# Patient Record
Sex: Female | Born: 1990 | Hispanic: Yes | Marital: Single | Smoking: Never smoker
Health system: Southern US, Community
[De-identification: ages and names within clinical notes are randomized; demographics above are authoritative.]

## PROBLEM LIST (undated history)

## (undated) DIAGNOSIS — Z789 Other specified health status: Secondary | ICD-10-CM

## (undated) HISTORY — PX: NO PAST SURGERIES: SHX2092

---

## 2018-11-27 NOTE — L&D Delivery Note (Signed)
Delivery Note At 2:24 PM a viable female was delivered via Vaginal, Spontaneous (Presentation:VTX/ LOA ;  ).  APGAR8/9: , ; weight: unassigned   .   Placenta status:intact  , .  Cord:  with the following complications:+ COVID patient   Anesthesia:  Iv stadol Episiotomy: none   Lacerations:  none Suture Repair: n/a Est. Blood Loss (mL):  300 cc  Mom to Stay in LDR #5  Baby to Couplet care / Skin to Skin.  Jessica Douglas 08/06/2019, 2:50 PM

## 2019-01-20 ENCOUNTER — Emergency Department: Payer: Self-pay

## 2019-01-20 ENCOUNTER — Encounter: Payer: Self-pay | Admitting: Emergency Medicine

## 2019-01-20 ENCOUNTER — Emergency Department
Admission: EM | Admit: 2019-01-20 | Discharge: 2019-01-20 | Disposition: A | Discharge status: Home / Self Care | Payer: Self-pay | Attending: Emergency Medicine | Admitting: Emergency Medicine

## 2019-01-20 DIAGNOSIS — R102 Pelvic and perineal pain: Secondary | ICD-10-CM | POA: Insufficient documentation

## 2019-01-20 DIAGNOSIS — M545 Low back pain: Secondary | ICD-10-CM | POA: Insufficient documentation

## 2019-01-20 DIAGNOSIS — O9989 Other specified diseases and conditions complicating pregnancy, childbirth and the puerperium: Secondary | ICD-10-CM | POA: Insufficient documentation

## 2019-01-20 DIAGNOSIS — Z3491 Encounter for supervision of normal pregnancy, unspecified, first trimester: Secondary | ICD-10-CM

## 2019-01-20 DIAGNOSIS — Z3A08 8 weeks gestation of pregnancy: Secondary | ICD-10-CM | POA: Insufficient documentation

## 2019-01-20 LAB — URINALYSIS, COMPLETE (UACMP) WITH MICROSCOPIC
BILIRUBIN URINE: NEGATIVE
Bacteria, UA: NONE SEEN
Glucose, UA: NEGATIVE mg/dL
KETONES UR: NEGATIVE mg/dL
LEUKOCYTE UA: NEGATIVE
Nitrite: NEGATIVE
Protein, ur: NEGATIVE mg/dL
Specific Gravity, Urine: 1.026 (ref 1.005–1.030)
pH: 5 (ref 5.0–8.0)

## 2019-01-20 LAB — HCG, QUANTITATIVE, PREGNANCY: hCG, Beta Chain, Quant, S: 166234 m[IU]/mL — ABNORMAL HIGH (ref <5)

## 2019-01-20 LAB — CBC
HCT: 37.9 % (ref 36.0–46.0)
Hemoglobin: 12.7 g/dL (ref 12.0–15.0)
MCH: 28.4 pg (ref 26.0–34.0)
MCHC: 33.5 g/dL (ref 30.0–36.0)
MCV: 84.8 fL (ref 80.0–100.0)
NRBC: 0 % (ref 0.0–0.2)
Platelets: 311 10*3/uL (ref 150–400)
RBC: 4.47 MIL/uL (ref 3.87–5.11)
RDW: 13.3 % (ref 11.5–15.5)
WBC: 9.9 10*3/uL (ref 4.0–10.5)

## 2019-01-20 LAB — POCT PREGNANCY, URINE: Preg Test, Ur: POSITIVE — AB

## 2019-01-20 LAB — COMPREHENSIVE METABOLIC PANEL
ALT: 33 U/L (ref 0–44)
AST: 24 U/L (ref 15–41)
Albumin: 4.1 g/dL (ref 3.5–5.0)
Alkaline Phosphatase: 58 U/L (ref 38–126)
Anion gap: 9 (ref 5–15)
BUN: 10 mg/dL (ref 6–20)
CO2: 21 mmol/L — ABNORMAL LOW (ref 22–32)
Calcium: 9.7 mg/dL (ref 8.9–10.3)
Chloride: 103 mmol/L (ref 98–111)
Creatinine, Ser: 0.52 mg/dL (ref 0.44–1.00)
GFR calc Af Amer: >60 mL/min (ref >60)
GFR calc non Af Amer: >60 mL/min (ref >60)
Glucose, Bld: 118 mg/dL — ABNORMAL HIGH (ref 70–99)
POTASSIUM: 3.4 mmol/L — AB (ref 3.5–5.1)
SODIUM: 133 mmol/L — AB (ref 135–145)
Total Bilirubin: 0.6 mg/dL (ref 0.3–1.2)
Total Protein: 7.6 g/dL (ref 6.5–8.1)

## 2019-01-20 LAB — LIPASE, BLOOD: Lipase: 26 U/L (ref 11–51)

## 2019-01-20 LAB — ABO/RH: ABO/RH(D): A POS

## 2019-01-20 MED ORDER — PRENATAL COMPLETE 14-0.4 MG PO TABS: 1.0000 | ORAL_TABLET | Freq: Every day | ORAL | 3 refills | Status: AC

## 2019-01-20 MED ORDER — ONDANSETRON 4 MG PO TBDP: 4.0000 mg | ORAL_TABLET | Freq: Three times a day (TID) | ORAL | 0 refills | Status: AC | PRN

## 2019-01-20 NOTE — ED Provider Notes (Signed)
[facility] Emergency Department Provider Note  ____________________________________________   First MD Initiated Contact with Patient 01/20/19 1611     (approximate)  I have reviewed the triage vital signs and the nursing notes.   HISTORY  Chief Complaint Abdominal Pain and Possible Pregnancy    HPI Jessica Douglas is a 28 y.o. female presents emergency department complaint of low back pain and abdominal cramping.  She thinks she is 7 to [redacted] weeks pregnant.  No bleeding but states she had similar symptoms when she had a miscarriage.  She denies any fever or chills.  She denies any known injury.    History reviewed. No pertinent past medical history.  There are no active problems to display for this patient.   History reviewed. No pertinent surgical history.  Prior to Admission medications   Medication Sig Start Date End Date Taking? Authorizing Provider  ondansetron (ZOFRAN-ODT) 4 MG disintegrating tablet Take 1 tablet (4 mg total) by mouth every 8 (eight) hours as needed. 01/20/19   Sherrie Mustache Roselyn Bering, PA-C  Prenatal Vit-Fe Fumarate-FA (PRENATAL COMPLETE) 14-0.4 MG TABS Take 1 tablet by mouth daily. 01/20/19   [name], PA-C    Allergies Patient has no allergy information on record.  No family history on file.  Social History Social History   Tobacco Use  . Smoking status: Not on file  Substance Use Topics  . Alcohol use: Not on file  . Drug use: Not on file    Review of Systems  Constitutional: No fever/chills Eyes: No visual changes. ENT: No sore throat. Respiratory: Denies cough Genitourinary: Negative for dysuria.  Positive for cramping, positive pregnancy Musculoskeletal: Negative for back pain. Skin: Negative for rash.    ____________________________________________   PHYSICAL EXAM:  VITAL SIGNS: ED Triage Vitals  Enc Vitals Group     BP 01/20/19 1502 114/75     Pulse Rate 01/20/19 1502 97     Resp 01/20/19  1502 18     Temp 01/20/19 1502 98.8 F (37.1 C)     Temp Source 01/20/19 1502 Oral     SpO2 01/20/19 1502 100 %     Weight 01/20/19 1456 140 lb (63.5 kg)     Height 01/20/19 1456 5\' 4"  (1.626 m)     Head Circumference --      Peak Flow --      Pain Score 01/20/19 1456 1     Pain Loc --      Pain Edu? --      Excl. in GC? --     Constitutional: Alert and oriented. Well appearing and in no acute distress. Eyes: Conjunctivae are normal.  Head: Atraumatic. Nose: No congestion/rhinnorhea. Mouth/Throat: Mucous membranes are moist.   Neck:  supple no lymphadenopathy noted Cardiovascular: Normal rate, regular rhythm. Heart sounds are normal Respiratory: Normal respiratory effort.  No retractions, lungs c t a  Abd: soft tender in left lower quadrant, Bs normal all 4 quad GU: deferred Musculoskeletal: FROM all extremities, warm and well perfused Neurologic:  Normal speech and language.  Skin:  Skin is warm, dry and intact. No rash noted. Psychiatric: Mood and affect are normal. Speech and behavior are normal.  ____________________________________________   LABS (all labs ordered are listed, but only abnormal results are displayed)  Labs Reviewed  HCG, QUANTITATIVE, PREGNANCY - Abnormal; Notable for the following components:      Result Value   hCG, Beta Francene Finders 628,366 (*)    All other  components within normal limits  COMPREHENSIVE METABOLIC PANEL - Abnormal; Notable for the following components:   Sodium 133 (*)    Potassium 3.4 (*)    CO2 21 (*)    Glucose, Bld 118 (*)    All other components within normal limits  URINALYSIS, COMPLETE (UACMP) WITH MICROSCOPIC - Abnormal; Notable for the following components:   Color, Urine YELLOW (*)    APPearance CLEAR (*)    Hgb urine dipstick SMALL (*)    All other components within normal limits  POCT PREGNANCY, URINE - Abnormal; Notable for the following components:   Preg Test, Ur POSITIVE (*)    All other components within  normal limits  LIPASE, BLOOD  CBC  POC URINE PREG, ED  ABO/RH   ____________________________________________   ____________________________________________  RADIOLOGY  Ultrasound OB, shows a viable IUP with  heartbeat  ____________________________________________   PROCEDURES  Procedure(s) performed: No  Procedures    ____________________________________________   INITIAL IMPRESSION / ASSESSMENT AND PLAN / ED COURSE  Pertinent labs & imaging results that were available during my care of the patient were reviewed by me and considered in my medical decision making (see chart for details).   Patient is 28 year old female presents emergency department with concerns of threatened miscarriage.  Physical exam the abdomen is slightly tender in the left lower quadrant.  ABO/Rh is a positive, POC pregnancy is positive, hCG shows 166,234, urinalysis is normal, comprehensive metabolic panel shows minimally decreased sodium and potassium, ultrasound OB less than 14 weeks shows a viable IUP  Explained all of the test results to the patient.  She was given a prescription for prenatal vitamins and Zofran for any nausea/vomiting.  She is to follow-up with the [facility].  She already has a card with an appointment.  She is to return if she has any bleeding or concerns of miscarriage.  She states she understands and will comply.  She was discharged in stable condition.     As part of my medical decision making, I reviewed the following data within the electronic MEDICAL RECORD NUMBER Nursing notes reviewed and incorporated, Interpreter needed, Labs reviewed see above, Old chart reviewed, Radiograph reviewed ultrasound shows a viable IUP, Notes from prior ED visits and [place] Controlled Substance Database  ____________________________________________   FINAL CLINICAL IMPRESSION(S) / ED DIAGNOSES  Final diagnoses:  First trimester pregnancy      NEW MEDICATIONS STARTED  DURING THIS VISIT:  Discharge Medication List as of 01/20/2019  6:40 PM    START taking these medications   Details  ondansetron (ZOFRAN-ODT) 4 MG disintegrating tablet Take 1 tablet (4 mg total) by mouth every 8 (eight) hours as needed., Starting Mon 01/20/2019, Normal    Prenatal Vit-Fe Fumarate-FA (PRENATAL COMPLETE) 14-0.4 MG TABS Take 1 tablet by mouth daily., Starting Mon 01/20/2019, Normal         Note:  This document was prepared using Dragon voice recognition software and may include unintentional dictation errors.    [name], PA-C 01/20/19 1844    Jeanmarie Plant, MD 01/20/19 [phone]

## 2019-01-20 NOTE — ED Triage Notes (Signed)
Per interpreter, pt is approx [redacted] weeks pregnant and has had some abd cramping and low back pain for the past 3 days. Denies vaginal bleeding

## 2019-01-20 NOTE — ED Notes (Signed)
See triage note.

## 2019-01-20 NOTE — ED Notes (Signed)
Lab will add on ABO/RH. Spoke with [place].in [facility].

## 2019-01-20 NOTE — ED Notes (Signed)
See triage note  Presents with some lower back pain and some abd cramping  States she is approx 7 weeks preg  No bleeding at present

## 2019-02-13 ENCOUNTER — Other Ambulatory Visit: Payer: Self-pay | Admitting: Family Medicine

## 2019-02-13 DIAGNOSIS — Z3482 Encounter for supervision of other normal pregnancy, second trimester: Secondary | ICD-10-CM

## 2019-02-14 LAB — OB RESULTS CONSOLE RUBELLA ANTIBODY, IGM: Rubella: IMMUNE

## 2019-02-14 LAB — OB RESULTS CONSOLE HEPATITIS B SURFACE ANTIGEN: Hepatitis B Surface Ag: NEGATIVE

## 2019-02-14 LAB — OB RESULTS CONSOLE VARICELLA ZOSTER ANTIBODY, IGG: Varicella: IMMUNE

## 2019-03-17 ENCOUNTER — Ambulatory Visit: Payer: Self-pay

## 2019-03-24 ENCOUNTER — Ambulatory Visit: Payer: Self-pay

## 2019-03-27 ENCOUNTER — Ambulatory Visit
Admission: RE | Admit: 2019-03-27 | Discharge: 2019-03-27 | Disposition: A | Discharge status: Home / Self Care | Payer: Medicaid Other | Source: Ambulatory Visit | Attending: Family Medicine | Admitting: Family Medicine

## 2019-03-27 ENCOUNTER — Other Ambulatory Visit: Payer: Self-pay

## 2019-03-27 ENCOUNTER — Ambulatory Visit: Payer: Self-pay

## 2019-03-27 ENCOUNTER — Encounter (INDEPENDENT_AMBULATORY_CARE_PROVIDER_SITE_OTHER): Payer: Self-pay

## 2019-03-27 DIAGNOSIS — Z3482 Encounter for supervision of other normal pregnancy, second trimester: Secondary | ICD-10-CM

## 2019-05-30 ENCOUNTER — Encounter: Payer: Self-pay | Admitting: *Deleted

## 2019-05-30 ENCOUNTER — Observation Stay
Admission: EM | Admit: 2019-05-30 | Discharge: 2019-05-30 | Disposition: A | Discharge status: Home / Self Care | Payer: Medicaid Other | Attending: Obstetrics and Gynecology | Admitting: Obstetrics and Gynecology

## 2019-05-30 ENCOUNTER — Other Ambulatory Visit: Payer: Self-pay

## 2019-05-30 DIAGNOSIS — Z3A29 29 weeks gestation of pregnancy: Secondary | ICD-10-CM | POA: Diagnosis not present

## 2019-05-30 DIAGNOSIS — B9689 Other specified bacterial agents as the cause of diseases classified elsewhere: Secondary | ICD-10-CM | POA: Insufficient documentation

## 2019-05-30 DIAGNOSIS — O23593 Infection of other part of genital tract in pregnancy, third trimester: Principal | ICD-10-CM | POA: Insufficient documentation

## 2019-05-30 DIAGNOSIS — O26893 Other specified pregnancy related conditions, third trimester: Secondary | ICD-10-CM | POA: Diagnosis present

## 2019-05-30 HISTORY — DX: Other specified health status: Z78.9

## 2019-05-30 LAB — URINALYSIS, ROUTINE W REFLEX MICROSCOPIC
Bacteria, UA: NONE SEEN
Bilirubin Urine: NEGATIVE
Glucose, UA: NEGATIVE mg/dL
Hgb urine dipstick: NEGATIVE
Ketones, ur: NEGATIVE mg/dL
Nitrite: NEGATIVE
Protein, ur: NEGATIVE mg/dL
Specific Gravity, Urine: 1.009 (ref 1.005–1.030)
pH: 7 (ref 5.0–8.0)

## 2019-05-30 LAB — WET PREP, GENITAL
Sperm: NONE SEEN
Trich, Wet Prep: NONE SEEN
Yeast Wet Prep HPF POC: NONE SEEN

## 2019-05-30 MED ORDER — OXYCODONE-ACETAMINOPHEN 5-325 MG PO TABS: 2.0000 | ORAL_TABLET | ORAL | Status: DC | PRN

## 2019-05-30 NOTE — OB Triage Note (Signed)
Discharge home, ambulatory. Discharge instructions reviewed with pt and interpreter. Questions answered.

## 2019-06-02 NOTE — Discharge Summary (Signed)
TRIAGE NOTE to rule out Preterm Labor   History of Present Illness: Jessica Douglas is a 28 y.o. G3P1011 at 1084w1d presenting to triage for preterm pain.  No contractions, good fetal movement. No vaginal bleeding or loss of fluid.No recent intercourse  Patient Active Problem List   Diagnosis Date Noted  . Indication for care in labor or delivery 05/30/2019    Past Medical History:  Diagnosis Date  . Medical history non-contributory     Past Surgical History:  Procedure Laterality Date  . NO PAST SURGERIES      OB History  Gravida Para Term Preterm AB Living  3 1 1   1 1   SAB TAB Ectopic Multiple Live Births               # Outcome Date GA Lbr Len/2nd Weight Sex Delivery Anes PTL Lv  3 Current           2 AB           1 Term             Social History   Socioeconomic History  . Marital status: Single    Spouse name: Not on file  . Number of children: Not on file  . Years of education: Not on file  . Highest education level: Not on file  Occupational History  . Not on file  Social Needs  . Financial resource strain: Not on file  . Food insecurity    Worry: Not on file    Inability: Not on file  . Transportation needs    Medical: Not on file    Non-medical: Not on file  Tobacco Use  . Smoking status: Not on file  Substance and Sexual Activity  . Alcohol use: Not Currently    Frequency: Never  . Drug use: Never  . Sexual activity: Not on file  Lifestyle  . Physical activity    Days per week: Not on file    Minutes per session: Not on file  . Stress: Not on file  Relationships  . Social Musicianconnections    Talks on phone: Not on file    Gets together: Not on file    Attends religious service: Not on file    Active member of club or organization: Not on file    Attends meetings of clubs or organizations: Not on file    Relationship status: Not on file  Other Topics Concern  . Not on file  Social History Narrative  . Not on file    History  reviewed. No pertinent family history.  No Known Allergies  No medications prior to admission.    Review of Systems - See HPI for OB specific ROS.   Vitals:  BP 107/69 (BP Location: Left Arm)   Pulse 89   Temp 98.2 F (36.8 C) (Oral)   Resp 16   Ht 5\' 2"  (1.575 m)   Wt 67.6 kg   LMP 11/10/2018   BMI 27.25 kg/m  Physical Examination:  Cervix: deferred Membranes:intact Tocometer: Flat NST  Baseline: 130 Variability: moderate Accelerations present x >2 Decelerations absent Time 20mins  Interpretation: reactive NST, category 1 tracing  ----- Christeen DouglasBethany Svetlana Bagby, MD MPH Attending Obstetrician and Gynecologist [facility], Department of OB/GYN [facility]   Assessment and Plan: Patient Active Problem List   Diagnosis Date Noted  . Indication for care in labor or delivery 05/30/2019    1. Bacterial vaginosis diagnosed. Flagyl sent to the  pharmacy. Precautions reviewed. F/u as scheduled 2. Reassuring fetal testings, reaction NST  Angelina Pih, MD, MPH

## 2019-06-05 LAB — OB RESULTS CONSOLE HIV ANTIBODY (ROUTINE TESTING): HIV: NONREACTIVE

## 2019-06-05 LAB — OB RESULTS CONSOLE RPR: RPR: NONREACTIVE

## 2019-06-29 ENCOUNTER — Other Ambulatory Visit: Payer: Self-pay

## 2019-06-29 ENCOUNTER — Emergency Department
Admission: EM | Admit: 2019-06-29 | Discharge: 2019-06-29 | Disposition: A | Discharge status: Home / Self Care | Payer: Medicaid Other | Attending: Emergency Medicine | Admitting: Emergency Medicine

## 2019-06-29 ENCOUNTER — Encounter: Payer: Self-pay | Admitting: Emergency Medicine

## 2019-06-29 DIAGNOSIS — U071 COVID-19: Secondary | ICD-10-CM

## 2019-06-29 DIAGNOSIS — R05 Cough: Secondary | ICD-10-CM | POA: Diagnosis present

## 2019-06-29 LAB — SARS CORONAVIRUS 2 BY RT PCR (HOSPITAL ORDER, PERFORMED IN ~~LOC~~ HOSPITAL LAB): SARS Coronavirus 2: POSITIVE — AB

## 2019-06-29 NOTE — ED Provider Notes (Signed)
  Physical Exam  BP 105/77 (BP Location: Left Arm)   Pulse 82   Temp 98.9 F (37.2 C) (Oral)   Resp 18   Wt 77.1 kg   LMP 11/10/2018   SpO2 96%   BMI 31.09 kg/m   Physical Exam  ED Course/Procedures     Procedures  MDM  Patient is COVID-19 positive. She will be discharged home with quarantine instructions and a work note for the next 2 weeks. She is advised to follow up with her PCP/OBGYN for symptoms of concern. If symptoms worsen, she is to return to the ER .        Victorino Dike, FNP 06/29/19 1645    Nance Pear, MD 07/03/19 [phone]

## 2019-06-29 NOTE — ED Provider Notes (Signed)
[facility] Emergency Department Provider Note  ____________________________________________   First MD Initiated Contact with Patient 06/29/19 1454     (approximate)  I have reviewed the triage vital signs and the nursing notes.   HISTORY  Chief Complaint Cough and Fever    HPI Jessica Douglas is a 28 y.o. female who is 8 months pregnant is complaining of fever and cough that started yesterday.  Possible COVID contacts at home.  She states that her chest hurts, has had a dry cough, is very fatigued.  She still has her sense of smell and taste.  She denies abdominal pain or vomiting.  She denies any recent contractions.    Past Medical History:  Diagnosis Date   Medical history non-contributory     Patient Active Problem List   Diagnosis Date Noted   Indication for care in labor or delivery 05/30/2019    Past Surgical History:  Procedure Laterality Date   NO PAST SURGERIES      Prior to Admission medications   Medication Sig Start Date End Date Taking? Authorizing Provider  ondansetron (ZOFRAN-ODT) 4 MG disintegrating tablet Take 1 tablet (4 mg total) by mouth every 8 (eight) hours as needed. Patient not taking: Reported on 05/30/2019 01/20/19   Faythe GheeFisher, Araceli Coufal W, PA-C  Prenatal Vit-Fe Fumarate-FA (PRENATAL COMPLETE) 14-0.4 MG TABS Take 1 tablet by mouth daily. 01/20/19   Faythe GheeFisher, Lyden Redner W, PA-C    Allergies Patient has no known allergies.  History reviewed. No pertinent family history.  Social History Social History   Tobacco Use   Smoking status: Never Smoker   Smokeless tobacco: Never Used  Substance Use Topics   Alcohol use: Not Currently    Frequency: Never   Drug use: Never    Review of Systems  Constitutional: Positive fever/chills Eyes: No visual changes. ENT: Positive sore throat. Respiratory: Positive cough Genitourinary: Negative for dysuria. Musculoskeletal: Negative for back pain. Skin: Negative for  rash.    ____________________________________________   PHYSICAL EXAM:  VITAL SIGNS: ED Triage Vitals  Enc Vitals Group     BP 06/29/19 1332 105/77     Pulse Rate 06/29/19 1332 82     Resp 06/29/19 1332 18     Temp 06/29/19 1332 98.9 F (37.2 C)     Temp Source 06/29/19 1332 Oral     SpO2 06/29/19 1332 96 %     Weight 06/29/19 1333 170 lb (77.1 kg)     Height --      Head Circumference --      Peak Flow --      Pain Score --      Pain Loc --      Pain Edu? --      Excl. in GC? --     Constitutional: Alert and oriented. Well appearing and in no acute distress. Eyes: Conjunctivae are normal.  Head: Atraumatic. Nose: No congestion/rhinnorhea. Mouth/Throat: Mucous membranes are moist.   Neck:  supple no lymphadenopathy noted Cardiovascular: Normal rate, regular rhythm. Heart sounds are normal Respiratory: Normal respiratory effort.  No retractions, lungs c t a  Abd: soft nontender, fundus it is at approximately 8 months, Bs normal all 4 quad GU: deferred Musculoskeletal: FROM all extremities, warm and well perfused Neurologic:  Normal speech and language.  Skin:  Skin is warm, dry and intact. No rash noted. Psychiatric: Mood and affect are normal. Speech and behavior are normal.  ____________________________________________   LABS (all labs ordered are listed, but only  abnormal results are displayed)  Labs Reviewed  SARS CORONAVIRUS 2 (HOSPITAL ORDER, [place] [facility])   ____________________________________________   ____________________________________________  RADIOLOGY    ____________________________________________   PROCEDURES  Procedure(s) performed: No  Procedures    ____________________________________________   INITIAL IMPRESSION / ASSESSMENT AND PLAN / ED COURSE  Pertinent labs & imaging results that were available during my care of the patient were reviewed by me and considered in my medical decision making (see  chart for details).   Patient is a 28 year old female presents emergency department with complaints of COVID symptoms.  Patient is 8 months pregnant.  Patient does have language barrier is only speaks [place].  Physical exam shows patient to be nontoxic.  Exam is basically unremarkable.  Due to pregnancy and language barrier the in-house COVID 19 test was ordered  ----------------------------------------- 3:52 PM on 06/29/2019 -----------------------------------------  Care was transferred.  Vladimir Crofts triplet, FNP   Jessica Douglas was evaluated in Emergency Department on 06/29/2019 for the symptoms described in the history of present illness. She was evaluated in the context of the global COVID-19 pandemic, which necessitated consideration that the patient might be at risk for infection with the SARS-CoV-2 virus that causes COVID-19. Institutional protocols and algorithms that pertain to the evaluation of patients at risk for COVID-19 are in a state of rapid change based on information released by regulatory bodies including the CDC and federal and state organizations. These policies and algorithms were followed during the patient's care in the ED.   As part of my medical decision making, I reviewed the following data within the [place] notes reviewed and incorporated, Interpreter needed, Patient signed out to [place], fnp, Notes from prior ED visits and [place] Controlled Substance Database  ____________________________________________   FINAL CLINICAL IMPRESSION(S) / ED DIAGNOSES  Final diagnoses:  Suspected Covid-19 Virus Infection      NEW MEDICATIONS STARTED DURING THIS VISIT:  New Prescriptions   No medications on file     Note:  This document was prepared using Dragon voice recognition software and may include unintentional dictation errors.    Versie Starks, PA-C 06/29/19 1553    Nance Pear, MD 07/03/19 [phone]

## 2019-06-29 NOTE — ED Triage Notes (Addendum)
Pt arrived via POV with reports of fever and cough that started yesterday. Pt has had possible COVID contacts that are not confirmed positive but have sxs.   Pt wearing mask in triage. Pt reports subjective fevers at home, taking Tylenol - last dose was yesterday.  Pt is 8 months pregnant.

## 2019-08-01 LAB — OB RESULTS CONSOLE GBS: GBS: NEGATIVE

## 2019-08-03 LAB — OB RESULTS CONSOLE GC/CHLAMYDIA
Chlamydia: NEGATIVE
Gonorrhea: NEGATIVE

## 2019-08-06 ENCOUNTER — Inpatient Hospital Stay
Admission: EM | Admit: 2019-08-06 | Discharge: 2019-08-07 | DRG: 805 | Disposition: A | Discharge status: Home / Self Care | Payer: Medicaid Other | Attending: Obstetrics and Gynecology | Admitting: Obstetrics and Gynecology

## 2019-08-06 ENCOUNTER — Other Ambulatory Visit: Payer: Self-pay

## 2019-08-06 DIAGNOSIS — O9852 Other viral diseases complicating childbirth: Secondary | ICD-10-CM | POA: Diagnosis present

## 2019-08-06 DIAGNOSIS — O479 False labor, unspecified: Secondary | ICD-10-CM | POA: Diagnosis present

## 2019-08-06 DIAGNOSIS — U071 COVID-19: Secondary | ICD-10-CM

## 2019-08-06 DIAGNOSIS — O26893 Other specified pregnancy related conditions, third trimester: Secondary | ICD-10-CM | POA: Diagnosis present

## 2019-08-06 DIAGNOSIS — Z3A38 38 weeks gestation of pregnancy: Secondary | ICD-10-CM

## 2019-08-06 LAB — CBC
HCT: 41.4 % (ref 36.0–46.0)
Hemoglobin: 13.6 g/dL (ref 12.0–15.0)
MCH: 28.9 pg (ref 26.0–34.0)
MCHC: 32.9 g/dL (ref 30.0–36.0)
MCV: 88.1 fL (ref 80.0–100.0)
Platelets: 240 10*3/uL (ref 150–400)
RBC: 4.7 MIL/uL (ref 3.87–5.11)
RDW: 15.5 % (ref 11.5–15.5)
WBC: 8.3 10*3/uL (ref 4.0–10.5)
nRBC: 0 % (ref 0.0–0.2)

## 2019-08-06 LAB — CREATININE, SERUM
Creatinine, Ser: 0.56 mg/dL (ref 0.44–1.00)
GFR calc Af Amer: >60 mL/min (ref >60)
GFR calc non Af Amer: >60 mL/min (ref >60)

## 2019-08-06 LAB — TYPE AND SCREEN
ABO/RH(D): A POS
Antibody Screen: NEGATIVE

## 2019-08-06 LAB — SARS CORONAVIRUS 2 BY RT PCR (HOSPITAL ORDER, PERFORMED IN ~~LOC~~ HOSPITAL LAB): SARS Coronavirus 2: POSITIVE — AB

## 2019-08-06 MED ORDER — LACTATED RINGERS IV SOLN
INTRAVENOUS | Status: DC
  Administered 2019-08-06: 14:00:00 via INTRAVENOUS

## 2019-08-06 MED ORDER — LACTATED RINGERS IV SOLN: 500.0000 mL | INTRAVENOUS | Status: DC | PRN

## 2019-08-06 MED ORDER — BENZOCAINE-MENTHOL 20-0.5 % EX AERO: 1.0000 "application " | INHALATION_SPRAY | CUTANEOUS | Status: DC | PRN

## 2019-08-06 MED ORDER — FERROUS SULFATE 325 (65 FE) MG PO TABS
325.0000 mg | ORAL_TABLET | Freq: Two times a day (BID) | ORAL | Status: DC
  Administered 2019-08-06 – 2019-08-07 (×3): 325 mg via ORAL
  Filled 2019-08-06 (×3): qty 1

## 2019-08-06 MED ORDER — SOD CITRATE-CITRIC ACID 500-334 MG/5ML PO SOLN: 30.0000 mL | ORAL | Status: DC | PRN

## 2019-08-06 MED ORDER — MAGNESIUM HYDROXIDE 400 MG/5ML PO SUSP
30.0000 mL | ORAL | Status: DC | PRN
  Filled 2019-08-06: qty 30

## 2019-08-06 MED ORDER — OXYTOCIN 10 UNIT/ML IJ SOLN
INTRAMUSCULAR | Status: AC
  Filled 2019-08-06: qty 2

## 2019-08-06 MED ORDER — LIDOCAINE HCL (PF) 1 % IJ SOLN
INTRAMUSCULAR | Status: AC
  Filled 2019-08-06: qty 30

## 2019-08-06 MED ORDER — BUTORPHANOL TARTRATE 1 MG/ML IJ SOLN: 2.0000 mg | INTRAMUSCULAR | Status: DC | PRN

## 2019-08-06 MED ORDER — METHYLERGONOVINE MALEATE 0.2 MG/ML IJ SOLN
INTRAMUSCULAR | Status: AC
  Administered 2019-08-06: 15:00:00 0.2 mg
  Filled 2019-08-06: qty 1

## 2019-08-06 MED ORDER — DIPHENHYDRAMINE HCL 25 MG PO CAPS: 25.0000 mg | ORAL_CAPSULE | Freq: Four times a day (QID) | ORAL | Status: DC | PRN

## 2019-08-06 MED ORDER — OXYCODONE-ACETAMINOPHEN 5-325 MG PO TABS: 2.0000 | ORAL_TABLET | ORAL | Status: DC | PRN

## 2019-08-06 MED ORDER — ONDANSETRON HCL 4 MG PO TABS: 4.0000 mg | ORAL_TABLET | ORAL | Status: DC | PRN

## 2019-08-06 MED ORDER — DIBUCAINE (PERIANAL) 1 % EX OINT: 1.0000 "application " | TOPICAL_OINTMENT | CUTANEOUS | Status: DC | PRN

## 2019-08-06 MED ORDER — LIDOCAINE HCL (PF) 1 % IJ SOLN: 30.0000 mL | INTRAMUSCULAR | Status: DC | PRN

## 2019-08-06 MED ORDER — ONDANSETRON HCL 4 MG/2ML IJ SOLN: 4.0000 mg | Freq: Four times a day (QID) | INTRAMUSCULAR | Status: DC | PRN

## 2019-08-06 MED ORDER — ENOXAPARIN SODIUM 40 MG/0.4ML ~~LOC~~ SOLN
40.0000 mg | SUBCUTANEOUS | Status: DC
  Administered 2019-08-07: 40 mg via SUBCUTANEOUS
  Filled 2019-08-06: qty 0.4

## 2019-08-06 MED ORDER — OXYCODONE-ACETAMINOPHEN 5-325 MG PO TABS: 1.0000 | ORAL_TABLET | ORAL | Status: DC | PRN

## 2019-08-06 MED ORDER — IBUPROFEN 600 MG PO TABS
600.0000 mg | ORAL_TABLET | Freq: Four times a day (QID) | ORAL | Status: DC
  Administered 2019-08-06 – 2019-08-07 (×4): 600 mg via ORAL
  Filled 2019-08-06 (×4): qty 1

## 2019-08-06 MED ORDER — OXYTOCIN 40 UNITS IN NORMAL SALINE INFUSION - SIMPLE MED
INTRAVENOUS | Status: AC
  Filled 2019-08-06: qty 1000

## 2019-08-06 MED ORDER — AMMONIA AROMATIC IN INHA
RESPIRATORY_TRACT | Status: AC
  Filled 2019-08-06: qty 10

## 2019-08-06 MED ORDER — SIMETHICONE 80 MG PO CHEW: 80.0000 mg | CHEWABLE_TABLET | ORAL | Status: DC | PRN

## 2019-08-06 MED ORDER — MEASLES, MUMPS & RUBELLA VAC IJ SOLR
0.5000 mL | Freq: Once | INTRAMUSCULAR | Status: DC
  Filled 2019-08-06: qty 0.5

## 2019-08-06 MED ORDER — PRENATAL MULTIVITAMIN CH
1.0000 | ORAL_TABLET | Freq: Every day | ORAL | Status: DC
  Administered 2019-08-07: 10:00:00 1 via ORAL
  Filled 2019-08-06: qty 1

## 2019-08-06 MED ORDER — INFLUENZA VAC SPLIT QUAD 0.5 ML IM SUSY
0.5000 mL | PREFILLED_SYRINGE | INTRAMUSCULAR | Status: DC
  Filled 2019-08-06: qty 0.5

## 2019-08-06 MED ORDER — ONDANSETRON HCL 4 MG/2ML IJ SOLN: 4.0000 mg | INTRAMUSCULAR | Status: DC | PRN

## 2019-08-06 MED ORDER — BUTORPHANOL TARTRATE 1 MG/ML IJ SOLN
1.0000 mg | INTRAMUSCULAR | Status: DC | PRN
  Filled 2019-08-06: qty 1

## 2019-08-06 MED ORDER — ACETAMINOPHEN 325 MG PO TABS
650.0000 mg | ORAL_TABLET | ORAL | Status: DC | PRN
  Administered 2019-08-07 (×2): 650 mg via ORAL
  Filled 2019-08-06 (×2): qty 2

## 2019-08-06 MED ORDER — IBUPROFEN 600 MG PO TABS: 600.0000 mg | ORAL_TABLET | Freq: Four times a day (QID) | ORAL | Status: DC

## 2019-08-06 MED ORDER — ZOLPIDEM TARTRATE 5 MG PO TABS: 5.0000 mg | ORAL_TABLET | Freq: Every evening | ORAL | Status: DC | PRN

## 2019-08-06 MED ORDER — MISOPROSTOL 200 MCG PO TABS
ORAL_TABLET | ORAL | Status: AC
  Filled 2019-08-06: qty 4

## 2019-08-06 MED ORDER — METHYLERGONOVINE MALEATE 0.2 MG/ML IJ SOLN: 0.2000 mg | Freq: Once | INTRAMUSCULAR | Status: DC

## 2019-08-06 MED ORDER — OXYTOCIN 40 UNITS IN NORMAL SALINE INFUSION - SIMPLE MED
2.5000 [IU]/h | INTRAVENOUS | Status: DC
  Filled 2019-08-06: qty 1000

## 2019-08-06 MED ORDER — ACETAMINOPHEN 325 MG PO TABS: 650.0000 mg | ORAL_TABLET | ORAL | Status: DC | PRN

## 2019-08-06 MED ORDER — COCONUT OIL OIL: 1.0000 "application " | TOPICAL_OIL | Status: DC | PRN

## 2019-08-06 MED ORDER — WITCH HAZEL-GLYCERIN EX PADS: 1.0000 "application " | MEDICATED_PAD | CUTANEOUS | Status: DC | PRN

## 2019-08-06 MED ORDER — SENNOSIDES-DOCUSATE SODIUM 8.6-50 MG PO TABS
2.0000 | ORAL_TABLET | ORAL | Status: DC
  Administered 2019-08-07: 2 via ORAL
  Filled 2019-08-06: qty 2

## 2019-08-06 MED ORDER — OXYTOCIN BOLUS FROM INFUSION
500.0000 mL | Freq: Once | INTRAVENOUS | Status: DC
  Administered 2019-08-06: 14:00:00 500 mL via INTRAVENOUS

## 2019-08-06 MED ORDER — ENOXAPARIN SODIUM 40 MG/0.4ML ~~LOC~~ SOLN: 40.0000 mg | SUBCUTANEOUS | Status: DC

## 2019-08-06 NOTE — Progress Notes (Signed)
Patient ID: Jessica Douglas, female   DOB: May 13, 1991, 28 y.o.   MRN: [id number] Appropriate screening questions perofrmed by [facility] with electronic translator and she( pt) stated no to all COVID questions . Once Covid test+ from [facility] and before moving into LDR#5 she stated that she had COVID one month ago .

## 2019-08-06 NOTE — Discharge Summary (Signed)
  Obstetrical Discharge Summary  Patient Name: Jessica Douglas DOB: [birth year] MRN: [id number]  Date of Admission: 08/06/2019 Date of Delivery:08/06/2019 Delivered by: Huel Cote MD Date of Discharge:08/07/19  Primary OB:  [facility] OVF:[id number] last menstrual period was 11/10/2018. EDC Estimated Date of Delivery: 08/17/19 Gestational Age at Delivery: [redacted]w[redacted]d   Antepartum complications: none Admitting Diagnosis: labor  Secondary Diagnosis: Patient Active Problem List   Diagnosis Date Noted  . Uterine contractions during pregnancy 08/06/2019  . COVID-19 virus detected 08/06/2019  . Vaginal delivery 08/06/2019  . Indication for care in labor or delivery 05/30/2019    Augmentation: n/a Complications: None Intrapartum complications/course:  Date of Delivery:  Delivered By: Huel Cote MD Delivery Type: spontaneous vaginal delivery Anesthesia:iv stadol Placenta: spontaneous Laceration: none Episiotomy: none Newborn Data: 08/06/2019 at 42 Live born female  Birth Weight:   APGAR: , 8/9  Newborn Delivery   Birth date/time: 08/06/2019 14:24:00 Delivery type: Vaginal, Spontaneous        Postpartum Procedures: none  Post partum course:  Patient had an uncomplicated postpartum course.  By time of discharge on PPD#1, her pain was controlled on oral pain medications; she had appropriate lochia and was ambulating, voiding without difficulty and tolerating regular diet.  She was deemed stable for discharge to home.    Marland Kitchen    Discharge Physical Exam:  BP 114/61 (BP Location: Right Arm)   Pulse 92   Temp 98.6 F (37 C) (Oral)   Resp 20   Ht 5\' 2"  (1.575 m)   Wt 77.1 kg   LMP 11/10/2018   SpO2 99%   Breastfeeding Unknown   BMI 31.09 kg/m   General: NAD CV: RRR Pulm: CTABL, nl effort ABD: s/nd/nt, fundus firm and below the umbilicus Lochia: moderate Incision: c/d/i DVT Evaluation: LE non-ttp, no evidence of DVT on exam.  Hemoglobin  Date Value Ref Range  Status  08/07/2019 12.5 12.0 - 15.0 g/dL Final   HCT  Date Value Ref Range Status  08/07/2019 38.0 36.0 - 46.0 % Final     Disposition: stable, discharge to home. Baby Feeding: breastmilk and formula Baby Disposition: home with mom  Rh Immune globulin given:  Rubella vaccine given:  Tdap vaccine given in AP or PP setting:  Flu vaccine given in AP or PP setting:   Prenatal Labs:  ABO, Rh: --/--/A POS Performed at [facility], [address] [place], [place] [address]  [phone]) Antibody:  neg Rubella:  imm /  VAricella IMM RPR:   NR HBsAg:   neg HIV:   neg GBS:   Neg  Covid+    Plan:  Jessica Douglas was discharged to home in good condition. Follow-up appointment with delivering provider in 6 weeks.  Discharge Medications: Allergies as of 08/07/2019   No Known Allergies     Medication List    TAKE these medications   ferrous sulfate 325 (65 FE) MG tablet Take 325 mg by mouth daily with breakfast.   ondansetron 4 MG disintegrating tablet Commonly known as: ZOFRAN-ODT Take 1 tablet (4 mg total) by mouth every 8 (eight) hours as needed.   Prenatal Complete 14-0.4 MG Tabs Take 1 tablet by mouth daily.         Signed: Benjaman Kindler 08/07/19

## 2019-08-06 NOTE — H&P (Signed)
Jessica Douglas is a 28 y.o. female presenting for uterine ctx  And LOF  EDC9/20/2020 Neg GBS  Covid status pending  CTX extremely painful  OB History    Gravida  3   Para  1   Term  1   Preterm      AB  1   Living  1     SAB      TAB      Ectopic      Multiple      Live Births             Past Medical History:  Diagnosis Date  . Medical history non-contributory    Past Surgical History:  Procedure Laterality Date  . NO PAST SURGERIES     Family History: family history is not on file. Social History:  reports that she has never smoked. She has never used smokeless tobacco. She reports previous alcohol use. She reports that she does not use drugs.     Maternal Diabetes: No Genetic Screening: Declined Maternal Ultrasounds/Referrals: none Fetal Ultrasounds or other Referrals:  None Maternal Substance Abuse:  No Significant Maternal Medications:  None Significant Maternal Lab Results:  Group B Strep negative Other Comments:  None  ROS History Dilation: 6 Effacement (%): 100 Station: 0 Exam by:: Schermerhorn MD Last menstrual period 11/10/2018. Exam Physical Exam  Lungs Cta  CV RRR  adb : soft NT  EFM 140  Ctx q 50min  + accels , no decels  Prenatal labs: ABO, Rh: --/--/A POS Performed at [facility], [address] [place], [place] [address]  [phone]) Antibody:  neg Rubella:  imm / VAricella IMM  RPR:   NRHBsAg:   neg HIV:   neg GBS:   Neg  Covid [ ]   Assessment/Plan: Active labor  IVF  Stadol prn  ClE per pt . Anticipate SVD  Translator via internet used    Jessica Douglas 08/06/2019, 1:33 PM

## 2019-08-07 LAB — CBC
HCT: 38 % (ref 36.0–46.0)
Hemoglobin: 12.5 g/dL (ref 12.0–15.0)
MCH: 28.8 pg (ref 26.0–34.0)
MCHC: 32.9 g/dL (ref 30.0–36.0)
MCV: 87.6 fL (ref 80.0–100.0)
Platelets: 220 10*3/uL (ref 150–400)
RBC: 4.34 MIL/uL (ref 3.87–5.11)
RDW: 15.5 % (ref 11.5–15.5)
WBC: 10.4 10*3/uL (ref 4.0–10.5)
nRBC: 0 % (ref 0.0–0.2)

## 2019-08-07 LAB — RPR: RPR Ser Ql: NONREACTIVE

## 2019-08-07 NOTE — Lactation Note (Signed)
This note was copied from a baby's chart. Lactation Consultation Note  Patient Name: Jessica Douglas Today's Date: 08/07/2019 Reason for consult: Initial assessment   Maternal Data    Feeding Feeding Type: Breast Fed Nipple Type: Slow - flow  Interventions  Mother stated that breast feeding is going well and had no questions. Lactation Tools Discussed/Used     Consult Status      Daryel November 08/07/2019, 12:31 PM

## 2019-08-07 NOTE — Progress Notes (Signed)
Patient discharged home with infant. Discharge instructions, prescriptions and follow up appointment given to and reviewed with patient. Patient verbalized understanding. patient wheeled out by by NT

## 2019-08-11 LAB — SURGICAL PATHOLOGY

## 2020-08-31 IMAGING — US OBSTETRIC 14+ WK ULTRASOUND
1 series · 13 of 28 positions shown · non-contrast
Comparison: none

CLINICAL DATA: Current assigned gestational age of 19 weeks 4 days.
Evaluate fetal anatomy and growth.

EXAM:
OBSTETRICAL ULTRASOUND >14 WKS

[Series 1: obstetric 14+ wk ultrasound · 0.23mm/px · 13 of 101 slices shown]
[im 4/101]
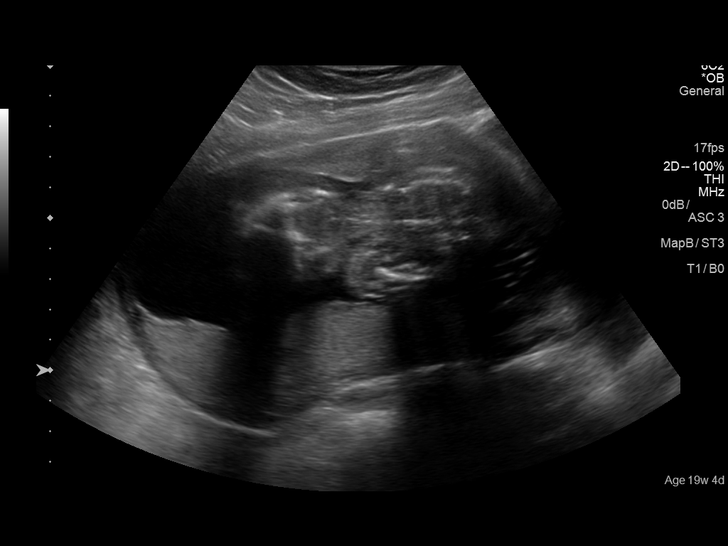
[im 12/101]
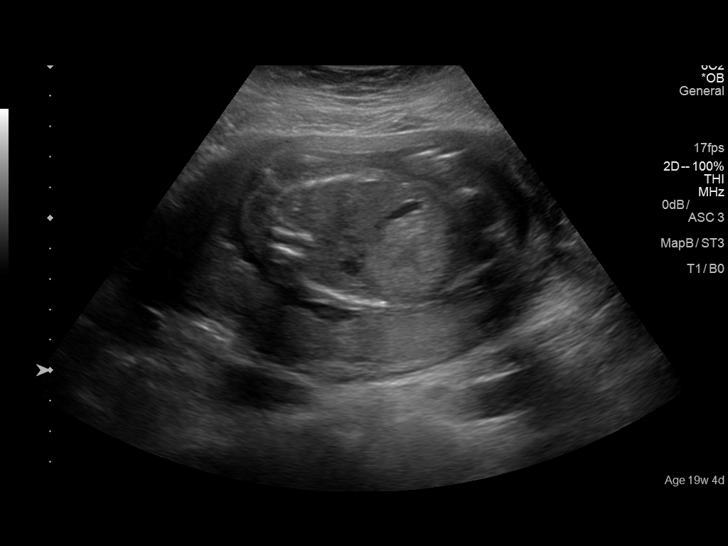
[im 19/101]
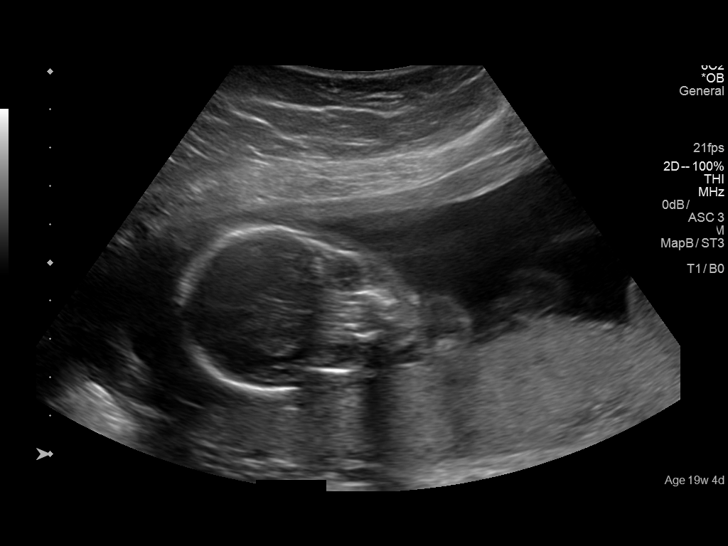
[im 26/101]
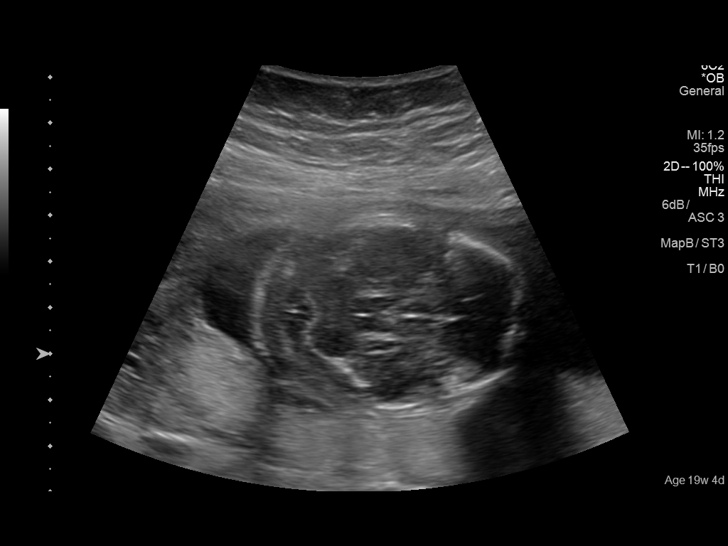
[im 34/101]
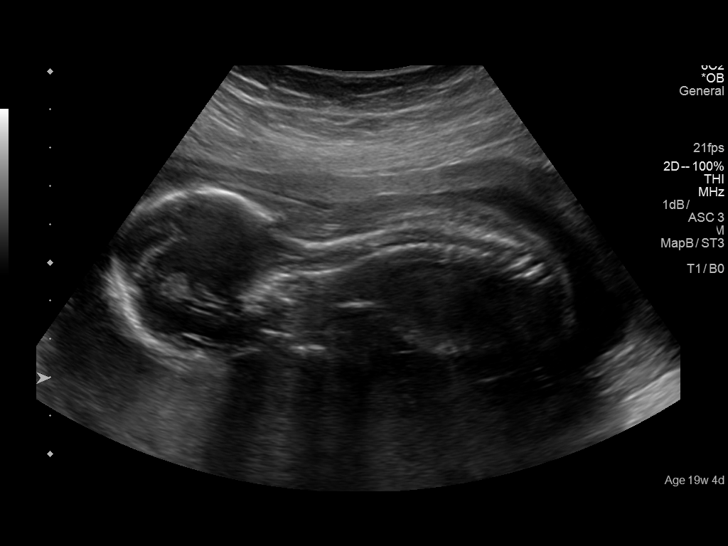
[im 41/101]
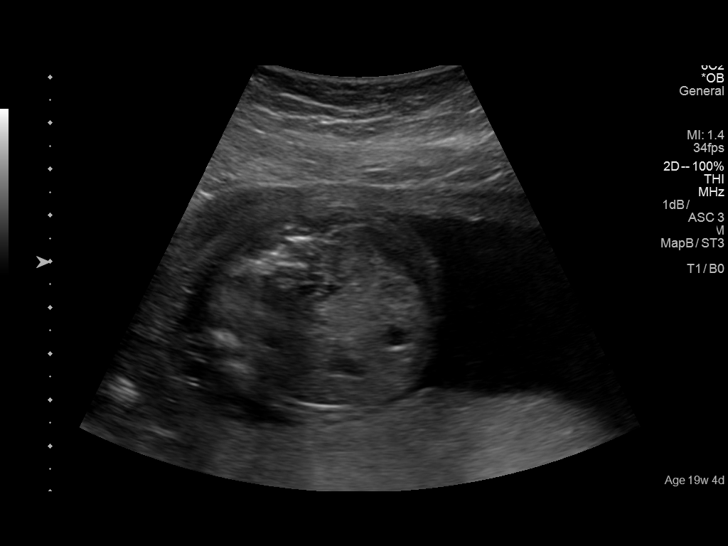
[im 52/101]
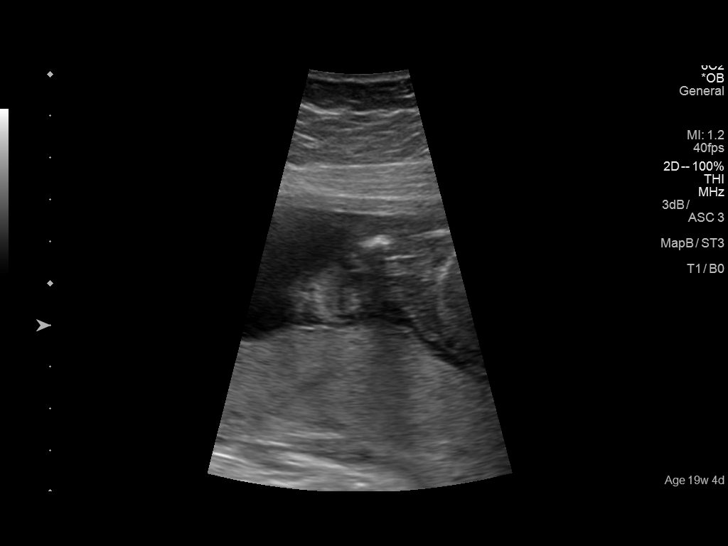
[im 60/101]
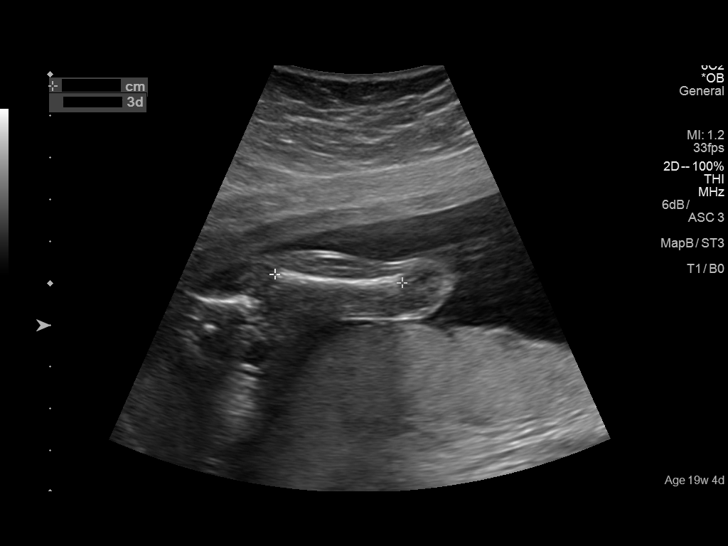
[im 67/101]
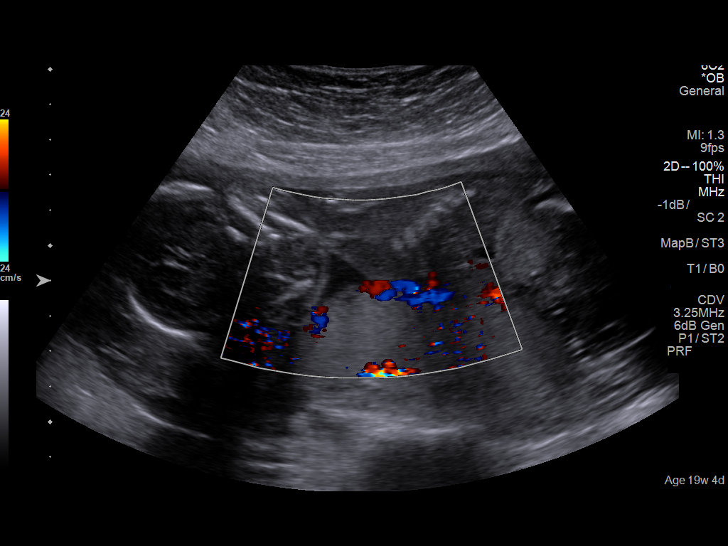
[im 75/101]
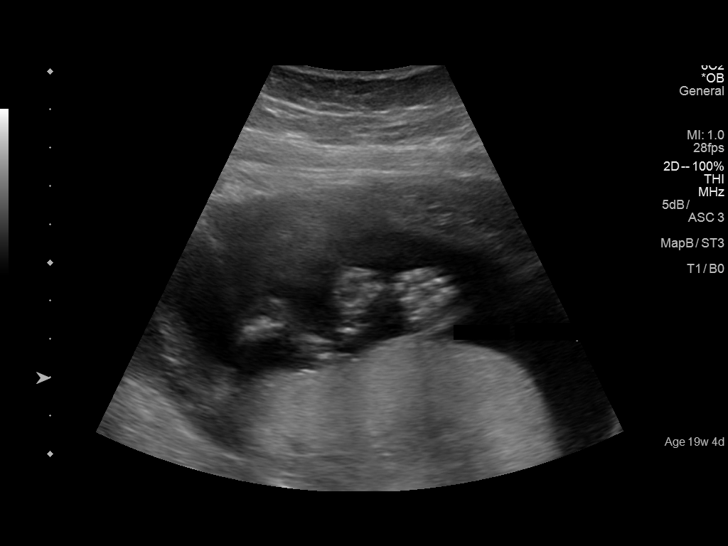
[im 82/101]
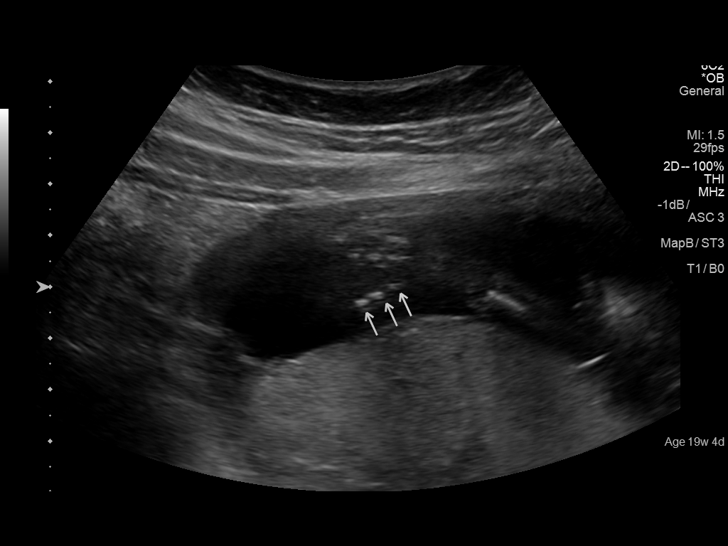
[im 89/101]
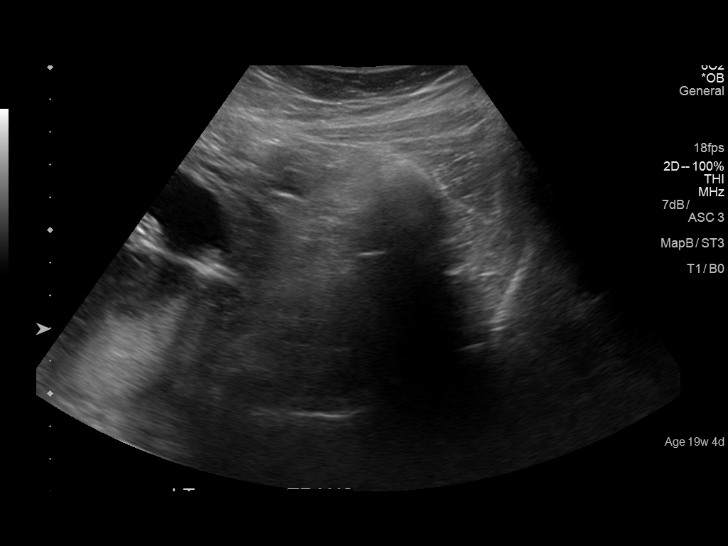
[im 97/101]
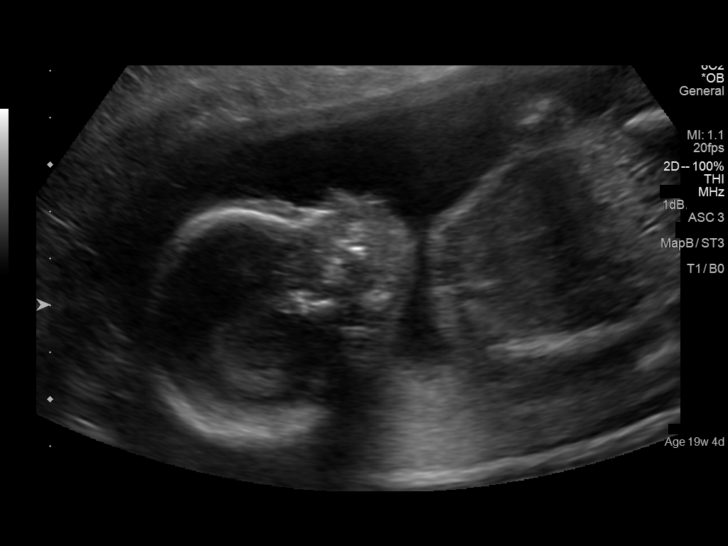

[13 of 28 positions shown; findings below may reference images not displayed]

FINDINGS: Number of Fetuses: 1

Heart Rate:  165 bpm

Movement: Yes

Presentation: Variable

Previa: No

Placental Location: Posterior

Amniotic Fluid (Subjective): Within normal limits

Amniotic Fluid (Objective):

Vertical pocket = 4.1cm

FETAL BIOMETRY

BPD: 4.2cm 18w 5d

HC:   16.3cm 19w 1d

AC:   14.5cm 20w 1d

FL:   3.1cm 19w 3d

Current Mean GA: 19w 3d US EDC: 08/18/2019

Assigned GA:  19w 4d assigned EDC: 08/17/2019

FETAL ANATOMY

Lateral Ventricles: Appears normal

Thalami/CSP: Appears normal

Posterior Fossa:  Appears normal

Nuchal Region: Appears normal   NFT= 4 mm

Upper Lip: Appears normal

Spine: Appears normal

4 Chamber Heart on Left: Appears normal

LVOT: Appears normal

RVOT: Not visualized

Stomach on Left: Appears normal

3 Vessel Cord: Appears normal

Cord Insertion site: Appears normal

Kidneys: Appears normal

Bladder: Appears normal

Extremities: Appears normal

Technically difficult due to: Fetal position

Maternal Findings:

Cervix:  4.0 cm TA
IMPRESSION: Current assigned gestational age of 19 weeks 4 days. Appropriate
fetal growth.

Normal visualized fetal anatomy.  No anomalies identified.

## 2024-05-28 ENCOUNTER — Encounter: Payer: Self-pay | Admitting: Family Medicine

## 2024-05-28 ENCOUNTER — Ambulatory Visit: Admitting: Family Medicine

## 2024-05-28 VITALS — BP 120/85 | HR 78 | Ht 61.0 in | Wt 159.2 lb

## 2024-05-28 DIAGNOSIS — Z3009 Encounter for other general counseling and advice on contraception: Secondary | ICD-10-CM

## 2024-05-28 DIAGNOSIS — Z30013 Encounter for initial prescription of injectable contraceptive: Secondary | ICD-10-CM | POA: Diagnosis not present

## 2024-05-28 DIAGNOSIS — Z113 Encounter for screening for infections with a predominantly sexual mode of transmission: Secondary | ICD-10-CM

## 2024-05-28 DIAGNOSIS — Z309 Encounter for contraceptive management, unspecified: Secondary | ICD-10-CM | POA: Diagnosis not present

## 2024-05-28 DIAGNOSIS — Z0000 Encounter for general adult medical examination without abnormal findings: Secondary | ICD-10-CM

## 2024-05-28 LAB — WET PREP FOR TRICH, YEAST, CLUE
Clue Cell Exam: POSITIVE — AB
Trichomonas Exam: NEGATIVE
Yeast Exam: NEGATIVE

## 2024-05-28 LAB — HM HIV SCREENING LAB: HM HIV Screening: NEGATIVE

## 2024-05-28 MED ORDER — MEDROXYPROGESTERONE ACETATE 150 MG/ML IM SUSP
150.0000 mg | INTRAMUSCULAR | Status: AC
  Administered 2024-05-28: 150 mg via INTRAMUSCULAR

## 2024-05-28 NOTE — Progress Notes (Signed)
 [employer] HE[facility] DEPARTMENT [facility] [address] [place] [address] Main phone: [phone]  Family Planning Visit - Initial Visit  Subjective:  Jessica Douglas is a 33 y.o.  [id number]   being seen today for an initial annual visit and to discuss reproductive life planning.  The patient is currently using hormonal injection for pregnancy prevention. Patient does not want a pregnancy in the next year.   Patient reports they are looking for a method with the following characteristics:  High efficacy at preventing pregnancy  Patient has the following medical conditions: There are no active problems to display for this patient.   Chief Complaint  Patient presents with   Annual Exam    HPI Patient reports to clinic for PE and to restart depo. Seen at [facility] in March 2024, pap smear up to date. Reports she has had some blurry vision over the last 3 months- has insurance, encouraged eye exam.   Patient denies other concerns about self.    Review of Systems  Constitutional:  Negative for weight loss.  Eyes:  Positive for blurred vision.  Respiratory:  Negative for cough and shortness of breath.   Cardiovascular:  Negative for claudication.  Gastrointestinal:  Negative for nausea.  Genitourinary:  Negative for dysuria and frequency.  Skin:  Negative for rash.  Neurological:  Negative for headaches.  Endo/Heme/Allergies:  Does not bruise/bleed easily.    Diabetes screening This patient is 33 y.o. with a BMI of Body mass index is 30.08 kg/m.SABRA  Is patient eligible for diabetes screening (age >35 and BMI >25)?  no  Was Hgb A1c ordered? not applicable  STI screening Patient reports 1 of partners in last year.  Does this patient desire STI screening?  Yes  Hepatitis C screening Has patient been screened once for HCV in the past?  No  No results found for: HCVAB  Does the patient meet criteria for HCV testing? No  (If yes--  Screen for HCV through [facility]) Criteria:  Since the last HCV result, does the patient have any of the following? - Current drug use - Have a partner with drug use - Has been incarcerated  Hepatitis B screening Does the patient meet criteria for HBV testing? No Criteria:  -Household, sexual or needle sharing contact with HBV -History of drug use -HIV positive -Those with known Hep C  Cervical Cancer Screening  No Cervical Cancer Screening results to display.  Health Maintenance Due  Topic Date Due   Hepatitis C Screening  Never done   DTaP/Tdap/Td (1 - Tdap) Never done   Hepatitis B Vaccines (1 of 3 - 19+ 3-dose series) Never done   HPV VACCINES (1 - 3-dose SCDM series) Never done   COVID-19 Vaccine (1 - 2024-25 season) Never done    The following portions of the patient's history were reviewed and updated as appropriate: allergies, current medications, past family history, past medical history, past social history, past surgical history and problem list. Problem list updated.  See flowsheet for further details and programmatic requirements Hyperlink available at the top of the signed note in blue.  Flow sheet content below:  Pregnancy Intention Screening Does the patient want to become pregnant in the next year?: No Does the patient's partner want to become pregnant in the next year?: No Would the patient like to discuss contraceptive options today?: Yes Results Follow up Password: [email or web address] Is it okay to contact you by mail?: Yes Contraception History  Past methods of contraception used by patient:: Intrauterine device or system (IDU,IUS), Hormonal Injection Adverse effects associated with Hormonal Injection: none Adverse effects associated with Intrauterine device or system (IUD,IUS): none Sexual History What age did you start your period?: 16 How often do you have your period?: monthly Date of last sex?: 05/24/24 Has the patient had unprotected sex within the last 5  days?: Yes Do you have sex with men, women, both men and women?: Men only In the past 2 months how many partners have you had sex with?: 1 In the past 12 months, how many partners have you had sex with?: 1 Is it possible that any of your sex partners in the past 12 months had sex with someone else whild they were still in a sexual relationship with you?: Yes What ways do you have sex?: Vaginal Do you or your partner use condoms and/or dental dams every time you have vaginal, oral or anal sex?: No Do you douche?: No Date of last HIV test?: 02/15/23 Have you ever had an STD?: Yes Have any of your partners had an STD?: Yes Partner Previous STD?: Chlamydia Date?:  (2 years ago) Have you or your partner ever shot up drugs?: No Have any of your partners used drugs in the past?: No Have you or your partners exchanged money or drugs for sex?: No Risk Factors for Hep B Household, sexual, or needle sharing contact of a person infected with Hep B: No Sexual contact with a person who uses drugs not as prescribed?: No Currently or Ever used drugs not as prescribed: No HIV Positive: No PRep Patient: No Men who have sex with men: No Have Hepatitis C: No History of Incarceration: No History of Homeslessness?: No Anal sex following anal drug use?: No Risk Factors for Hep C Currently using drugs not as prescribed: No Sexual partner(s) currently using drugs as not prescribed: No History of drug use: No HIV Positive: No People with a history of incarceration: No People born between the years of 49 and 11: No Counseling Education: Make informed decision about family planning, Provided preconception counseling, Reduce risk of transmission and protection from STD's and HIV, Understand BMI >25 or >18.5 is a health risk (weight management educational materials to be provided to client requests), Promoted daily consumption of MVI with folic acid if capable of conceiving., Review immunization history,  inform client of recommended vaccines per CDC's ACIP Guidelines and refer to Immunization clinic, Results of physical assessment and labs (if performed), How to discontinue the method selected and information on back up method used, How to use the method selected and information on back up method used, How to use the method consistently and correctly, Teach back method completed, Warning signs for rare but serious adverse events and what to do if they experience a warning sign (including emergency 24 hour number, where to seek emergency service outside of hours of operation), When to return for follow up (planned return schedule), PCP list given to patient, Is patient pregnant? Contraception Wrap Up Current Method: No Contraceptive Precautions End Method: Hormonal Injection Contraception Counseling Provided: Yes How was the end contraceptive method provided?: Provided on site Youth worker used: Language Line  Objective:   Vitals:   05/28/24 1535  BP: 120/85  Pulse: 78  Weight: 159 lb 3.2 oz (72.2 kg)  Height: 5' 1 (1.549 m)    Physical Exam Vitals and nursing note reviewed.  Constitutional:      Appearance: Normal appearance.  HENT:  Head: Normocephalic and atraumatic.     Mouth/Throat:     Mouth: Mucous membranes are moist.     Pharynx: Oropharynx is clear. No oropharyngeal exudate or posterior oropharyngeal erythema.  Pulmonary:     Effort: Pulmonary effort is normal.  Abdominal:     General: Abdomen is flat.     Palpations: There is no mass.     Tenderness: There is no abdominal tenderness. There is no rebound.  Genitourinary:    Comments: Declined genital exam- pt self swabbed Lymphadenopathy:     Head:     Right side of head: No preauricular or posterior auricular adenopathy.     Left side of head: No preauricular or posterior auricular adenopathy.     Cervical: No cervical adenopathy.     Upper Body:     Right upper body: No supraclavicular, axillary or  epitrochlear adenopathy.     Left upper body: No supraclavicular, axillary or epitrochlear adenopathy.  Skin:    General: Skin is warm and dry.     Findings: No rash.  Neurological:     Mental Status: She is alert and oriented to person, place, and time.     Assessment and Plan:  Jessica Douglas is a 33 y.o. female presenting to the [facility] Department for an initial annual wellness/contraceptive visit  1. Family planning (Primary) Contraception counseling:  Reviewed options based on patient desire and reproductive life plan. Patient is interested in Hormonal Injection. This was provided to the patient today.   Risks, benefits, and typical effectiveness rates were reviewed.  Questions were answered.  Written information was also given to the patient to review.    The patient will follow up in  3 months for surveillance.  The patient was told to call with any further questions, or with any concerns about this method of contraception.  Emphasized use of condoms 100% of the time for STI prevention.  Emergency Contraception Precautions (ECP): Patient assessed for need of ECP. She is not a candidate based on last sex within 4 days of LMP Educated on ECP and reviewed options.  Patient desires no method - patient politely declines any emergency contraception.    - medroxyPROGESTERone (DEPO-PROVERA) injection 150 mg  2. Screening for venereal disease  - Chlamydia/Gonorrhea [facility] - [facility] - Syphilis Serology, [facility] - WET PREP FOR TRICH, YEAST, CLUE  3. Well woman exam (no gynecological exam) -pap smear up to date -encouraged eye exam   Return in about 3 months (around 08/28/2024) for depo injection.  No future appointments. Due to language barrier, a Spanish interpreter [place], [place] [id number]) was present by phone during the history-taking, subsequent discussion, and physical exam with this patient.   Verneta Bers, [place]

## 2024-05-28 NOTE — Progress Notes (Signed)
 Pt is here for family planning visit.  Family planning education card reviewed and given to pt.  Wet prep results reviewed, no treatment required per standing orders. Condoms declined. Depo given in [facility] as ordered, tolerated well and reminder card given. Larraine JONELLE Northern, RN

## 2024-06-12 ENCOUNTER — Encounter: Payer: Self-pay | Admitting: Family Medicine

## 2024-07-01 ENCOUNTER — Ambulatory Visit

## 2024-07-01 VITALS — BP 115/79 | Ht 61.0 in | Wt 158.5 lb

## 2024-07-01 DIAGNOSIS — Z3202 Encounter for pregnancy test, result negative: Secondary | ICD-10-CM | POA: Diagnosis not present

## 2024-07-01 DIAGNOSIS — Z309 Encounter for contraceptive management, unspecified: Secondary | ICD-10-CM | POA: Diagnosis not present

## 2024-07-01 LAB — PREGNANCY, URINE: Preg Test, Ur: NEGATIVE

## 2024-07-01 NOTE — Progress Notes (Addendum)
 UPT negative. Patient reports LMP 05/16/2024 and had positive at home UPTs. Last sex 06/29/2024. Advised patient it may be too early for our UPT to pick up pregnancy hormone. Patient asked about blood pregnancy test. Educated patient we did not regularly provide blood pregnancy test, but she could come back in approx. 2 weeks for repeat UPT or visit urgent care for blood pregnancy test. Patient agreed. Patient did not want to give any other information at this time.  LL interpreter, Jessica Douglas [phone], helped during this visit.  Doyce CINDERELLA Shuck, RN
